# Patient Record
Sex: Female | Born: 1990 | Race: Black or African American | Hispanic: No | Marital: Single | Smoking: Current every day smoker
Health system: Southern US, Community
[De-identification: ages and names within clinical notes are randomized; demographics above are authoritative.]

## PROBLEM LIST (undated history)

## (undated) DIAGNOSIS — Z789 Other specified health status: Secondary | ICD-10-CM

---

## 2008-10-17 ENCOUNTER — Emergency Department: Payer: Self-pay | Admitting: Emergency Medicine

## 2015-04-09 ENCOUNTER — Emergency Department
Admission: EM | Admit: 2015-04-09 | Discharge: 2015-04-09 | Disposition: A | Discharge status: Home / Self Care | Payer: Self-pay | Attending: Emergency Medicine | Admitting: Emergency Medicine

## 2015-04-09 ENCOUNTER — Encounter: Payer: Self-pay | Admitting: Emergency Medicine

## 2015-04-09 DIAGNOSIS — Z720 Tobacco use: Secondary | ICD-10-CM | POA: Insufficient documentation

## 2015-04-09 DIAGNOSIS — J039 Acute tonsillitis, unspecified: Secondary | ICD-10-CM | POA: Insufficient documentation

## 2015-04-09 HISTORY — DX: Other specified health status: Z78.9

## 2015-04-09 LAB — POCT RAPID STREP A: Streptococcus, Group A Screen (Direct): NEGATIVE

## 2015-04-09 MED ORDER — LIDOCAINE VISCOUS 2 % MT SOLN: 20.0000 mL | OROMUCOSAL | Status: DC | PRN

## 2015-04-09 MED ORDER — AMOXICILLIN 500 MG PO TABS: 500.0000 mg | ORAL_TABLET | Freq: Two times a day (BID) | ORAL | Status: DC

## 2015-04-09 NOTE — ED Provider Notes (Signed)
[facility] Emergency Department Provider Note  ____________________________________________  Time seen: Approximately 9:58 AM  I have reviewed the triage vital signs and the nursing notes.   HISTORY  Chief Complaint Sore Throat   HPI Molly Shah is a 24 y.o. female Presents for evaluation of a 3 day history of sore throat. Patient states is progressively getting worse. Denies any fever chills nausea vomiting. States appetite is okay.   Past Medical History  Diagnosis Date  . Patient denies medical problems     There are no active problems to display for this patient.   No past surgical history on file.  Current Outpatient Rx  Name  Route  Sig  Dispense  Refill  . amoxicillin (AMOXIL) 500 MG tablet   Oral   Take 1 tablet (500 mg total) by mouth 2 (two) times daily.   20 tablet   0   . lidocaine (XYLOCAINE) 2 % solution   Mouth/Throat   Use as directed 20 mLs in the mouth or throat as needed for mouth pain.   100 mL   0     Allergies Review of patient's allergies indicates no known allergies.  No family history on file.  Social History Social History  Substance Use Topics  . Smoking status: Current Every Day Smoker  . Smokeless tobacco: Not on file  . Alcohol Use: Yes    Review of Systems Constitutional: No fever/chills Eyes: No visual changes. ENT: Positive for sore throat. Cardiovascular: Denies chest pain. Respiratory: Denies shortness of breath. Gastrointestinal: No abdominal pain.  No nausea, no vomiting.  No diarrhea.  No constipation. Genitourinary: Negative for dysuria. Musculoskeletal: Negative for back pain. Skin: Negative for rash. Neurological: Negative for headaches, focal weakness or numbness.  10-point ROS otherwise negative.  ____________________________________________   PHYSICAL EXAM:  VITAL SIGNS: ED Triage Vitals  Enc Vitals Group     BP 04/09/15 0911 117/68 mmHg     Pulse Rate 04/09/15 0911  74     Resp 04/09/15 0911 20     Temp 04/09/15 0911 98.1 F (36.7 C)     Temp Source 04/09/15 0911 Oral     SpO2 04/09/15 0911 100 %     Weight 04/09/15 0911 165 lb (74.844 kg)     Height 04/09/15 0911  (1.626 m)     Head Cir --      Peak Flow --      Pain Score 04/09/15 0911 8     Pain Loc --      Pain Edu? --      Excl. in GC? --     Constitutional: Alert and oriented. Well appearing and in no acute distress. Eyes: Conjunctivae are normal. PERRL. EOMI. Head: Atraumatic. Nose: No congestion/rhinnorhea. Mouth/Throat: Mucous membranes are moist.  Oral pharynx mildly erythematous with tonsillar exudate noted right greater than left. Neck: No stridor.  No cervical adenopathy. Cardiovascular: Normal rate, regular rhythm. Grossly normal heart sounds.  Good peripheral circulation. Respiratory: Normal respiratory effort.  No retractions. Lungs CTAB. Gastrointestinal: Soft and nontender. No distention. No abdominal bruits. No CVA tenderness. Musculoskeletal: No lower extremity tenderness nor edema.  No joint effusions. Neurologic:  Normal speech and language. No gross focal neurologic deficits are appreciated. No gait instability. Skin:  Skin is warm, dry and intact. No rash noted. Psychiatric: Mood and affect are normal. Speech and behavior are normal.  ____________________________________________   LABS (all labs ordered are listed, but only abnormal results are displayed)  Labs Reviewed  POCT RAPID STREP A   Rapid strep test negative. ___________________________________________   PROCEDURES  Procedure(s) performed: None  Critical Care performed: No  ____________________________________________   INITIAL IMPRESSION / ASSESSMENT AND PLAN / ED COURSE  Pertinent labs & imaging results that were available during my care of the patient were reviewed by me and considered in my medical decision making (see chart for details).  Acute exudative tonsillitis. Rx given for  amoxicillin 500 mg 3 times a day #30 Rx given for viscous lidocaine. Patient follow-up with PCP or return to the ER with any worsening symptomology. Work note given for 24 hours. She voices no other emergency medical complaints at this visit. ____________________________________________   FINAL CLINICAL IMPRESSION(S) / ED DIAGNOSES  Final diagnoses:  Tonsillitis with exudate      Evangeline Dakin, PA-C 04/09/15 [address] Shevaun Lovan, PA-C 04/09/15 1013  Sharyn Creamer, MD 04/09/15 [phone]

## 2015-04-09 NOTE — ED Notes (Signed)
Pt reports sore throat for the past 3 days. Denies fevers, NVD. Pt reports hurts to swallow.

## 2015-04-09 NOTE — Discharge Instructions (Signed)

## 2015-04-11 LAB — CULTURE, GROUP A STREP (THRC)

## 2016-04-14 ENCOUNTER — Ambulatory Visit
Admission: RE | Admit: 2016-04-14 | Discharge: 2016-04-14 | Disposition: A | Discharge status: Home / Self Care | Payer: Self-pay | Source: Ambulatory Visit | Attending: Chiropractor | Admitting: Chiropractor

## 2016-04-14 ENCOUNTER — Other Ambulatory Visit: Payer: Self-pay | Admitting: Chiropractor

## 2016-04-14 DIAGNOSIS — M545 Low back pain: Secondary | ICD-10-CM

## 2016-04-14 DIAGNOSIS — M542 Cervicalgia: Secondary | ICD-10-CM

## 2016-04-14 DIAGNOSIS — M546 Pain in thoracic spine: Secondary | ICD-10-CM | POA: Insufficient documentation

## 2016-09-07 ENCOUNTER — Emergency Department
Admission: EM | Admit: 2016-09-07 | Discharge: 2016-09-07 | Disposition: A | Discharge status: Home / Self Care | Payer: Self-pay | Attending: Emergency Medicine | Admitting: Emergency Medicine

## 2016-09-07 DIAGNOSIS — J302 Other seasonal allergic rhinitis: Secondary | ICD-10-CM | POA: Insufficient documentation

## 2016-09-07 DIAGNOSIS — F17200 Nicotine dependence, unspecified, uncomplicated: Secondary | ICD-10-CM | POA: Insufficient documentation

## 2016-09-07 MED ORDER — FLUTICASONE PROPIONATE 50 MCG/ACT NA SUSP: 2.0000 | Freq: Every day | NASAL | 0 refills | Status: AC

## 2016-09-07 NOTE — ED Provider Notes (Signed)
[facility] Emergency Department Provider Note   ____________________________________________   First MD Initiated Contact with Patient 09/07/16 1030     (approximate)  I have reviewed the triage vital signs and the nursing notes.   HISTORY  Chief Complaint Sore Throat    HPI Molly Shah is a 26 y.o. female is here with complaint of sore throat for the last 3 days. Patient states she has not had any fever. She does have complaints of cough, sneeze, nasal congestion with her sore throat. She has taken Tylenol without any relief.Patient rates her pain as 3 out of 10.   Past Medical History:  Diagnosis Date  . Patient denies medical problems     There are no active problems to display for this patient.   History reviewed. No pertinent surgical history.  Prior to Admission medications   Medication Sig Start Date End Date Taking? Authorizing Provider  fluticasone (FLONASE) 50 MCG/ACT nasal spray Place 2 sprays into both nostrils daily. 09/07/16 09/07/17  Tommi Rumps, PA-C    Allergies Patient has no known allergies.  No family history on file.  Social History Social History  Substance Use Topics  . Smoking status: Current Every Day Smoker  . Smokeless tobacco: Never Used  . Alcohol use Yes    Review of Systems Constitutional: No fever/chills Eyes: No visual changes. ENT: Positive sore throat. Cardiovascular: Denies chest pain. Respiratory: Denies shortness of breath. Positive occasional cough. Gastrointestinal:   No nausea, no vomiting. Genitourinary: Negative for dysuria. Musculoskeletal: Negative for body aches. Skin: Negative for rash. Neurological: Negative for headaches, focal weakness or numbness.  10-point ROS otherwise negative.  ____________________________________________   PHYSICAL EXAM:  VITAL SIGNS: ED Triage Vitals  Enc Vitals Group     BP 09/07/16 0928 115/66     Pulse Rate 09/07/16 0928 78     Resp  09/07/16 0928 18     Temp 09/07/16 0928 98.2 F (36.8 C)     Temp Source 09/07/16 0928 Oral     SpO2 09/07/16 0928 98 %     Weight 09/07/16 0924 157 lb (71.2 kg)     Height 09/07/16 0924 5\' 4"  (1.626 m)     Head Circumference --      Peak Flow --      Pain Score 09/07/16 0925 3     Pain Loc --      Pain Edu? --      Excl. in GC? --     Constitutional: Alert and oriented. Well appearing and in no acute distress. Eyes: Conjunctivae are normal. PERRL. EOMI. Head: Atraumatic. Nose: Mild congestion/rhinnorhea. Slightly pale, watery turbinates.  TMs are clear bilaterally. Mouth/Throat: Mucous membranes are moist.  Oropharynx non-erythematous. Positive posterior drainage. Neck: No stridor.   Hematological/Lymphatic/Immunilogical: No cervical lymphadenopathy. Cardiovascular: Normal rate, regular rhythm. Grossly normal heart sounds.  Good peripheral circulation. Respiratory: Normal respiratory effort.  No retractions. Lungs CTAB. Musculoskeletal: No lower extremity tenderness nor edema.  No joint effusions. Neurologic:  Normal speech and language. No gross focal neurologic deficits are appreciated. No gait instability. Skin:  Skin is warm, dry and intact. No rash noted. Psychiatric: Mood and affect are normal. Speech and behavior are normal.  ____________________________________________   LABS (all labs ordered are listed, but only abnormal results are displayed)  Labs Reviewed - No data to display   PROCEDURES  Procedure(s) performed: None  Procedures  Critical Care performed: No  ____________________________________________   INITIAL IMPRESSION / ASSESSMENT AND  PLAN / ED COURSE  Pertinent labs & imaging results that were available during my care of the patient were reviewed by me and considered in my medical decision making (see chart for details).   [facility] if any continued problems. She'll continue taking Tylenol or  ibuprofen if needed for fever, sore throat or headache. Patient is to increase fluids. She is also occurs to obtain Zyrtec or Claritin as needed for symptoms and begin using Flonase nasal spray 2 sprays to each nostril once a day.     ____________________________________________   FINAL CLINICAL IMPRESSION(S) / ED DIAGNOSES  Final diagnoses:  Acute seasonal allergic rhinitis, unspecified trigger      NEW MEDICATIONS STARTED DURING THIS VISIT:  Discharge Medication List as of 09/07/2016 10:51 AM    START taking these medications   Details  fluticasone (FLONASE) 50 MCG/ACT nasal spray Place 2 sprays into both nostrils daily., Starting Sun 09/07/2016, Until Mon 09/07/2017, Print         Note:  This document was prepared using Dragon voice recognition software and may include unintentional dictation errors.    Tommi RumpsRhonda L Summers, PA-C 09/07/16 1605    Governor Rooksebecca Lord, MD 09/10/16 [phone]

## 2016-09-07 NOTE — Discharge Instructions (Signed)
Follow-up with [facility] acute care clinic if any continued problems. Tylenol and ibuprofen if needed for fever, sore throat pain. Increase fluids. Obtain either Zyrtec D or Claritin-D for your symptoms. Begin Flonase nasal spray 2 sprays each nostril once a day.

## 2016-09-07 NOTE — ED Triage Notes (Signed)
Pt c/o sore throat for the past 3 days.  

## 2016-12-11 ENCOUNTER — Other Ambulatory Visit: Payer: Self-pay | Admitting: Family Medicine

## 2016-12-11 DIAGNOSIS — Z3482 Encounter for supervision of other normal pregnancy, second trimester: Secondary | ICD-10-CM

## 2017-01-08 ENCOUNTER — Encounter: Payer: Self-pay | Admitting: Radiology

## 2017-01-08 ENCOUNTER — Ambulatory Visit
Admission: RE | Admit: 2017-01-08 | Discharge: 2017-01-08 | Disposition: A | Discharge status: Home / Self Care | Payer: Medicaid Other | Source: Ambulatory Visit | Attending: Family Medicine | Admitting: Family Medicine

## 2017-01-08 ENCOUNTER — Ambulatory Visit: Admission: RE | Admit: 2017-01-08 | Payer: Self-pay | Source: Ambulatory Visit

## 2017-01-08 DIAGNOSIS — Z3482 Encounter for supervision of other normal pregnancy, second trimester: Secondary | ICD-10-CM | POA: Insufficient documentation

## 2017-02-24 ENCOUNTER — Other Ambulatory Visit: Payer: Self-pay | Admitting: Family Medicine

## 2017-02-24 DIAGNOSIS — Z3482 Encounter for supervision of other normal pregnancy, second trimester: Secondary | ICD-10-CM

## 2017-02-27 ENCOUNTER — Ambulatory Visit
Admission: RE | Admit: 2017-02-27 | Discharge: 2017-02-27 | Disposition: A | Discharge status: Home / Self Care | Payer: Medicaid Other | Source: Ambulatory Visit | Attending: Family Medicine | Admitting: Family Medicine

## 2017-03-13 ENCOUNTER — Other Ambulatory Visit: Payer: Self-pay | Admitting: Family Medicine

## 2017-03-13 DIAGNOSIS — Z3689 Encounter for other specified antenatal screening: Secondary | ICD-10-CM

## 2017-03-23 ENCOUNTER — Ambulatory Visit: Payer: Self-pay
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
## 2018-01-11 IMAGING — US US OB COMP +14 WK
1 series · 13 of 28 positions shown · non-contrast
Comparison: none

CLINICAL DATA: Pregnancy.  Evaluate fetal anatomy.

EXAM:
OBSTETRICAL ULTRASOUND >14 WKS

[Series 1: us ob comp +14 wk · 0.22mm/px · 13 of 98 slices shown]
[im 4/98]
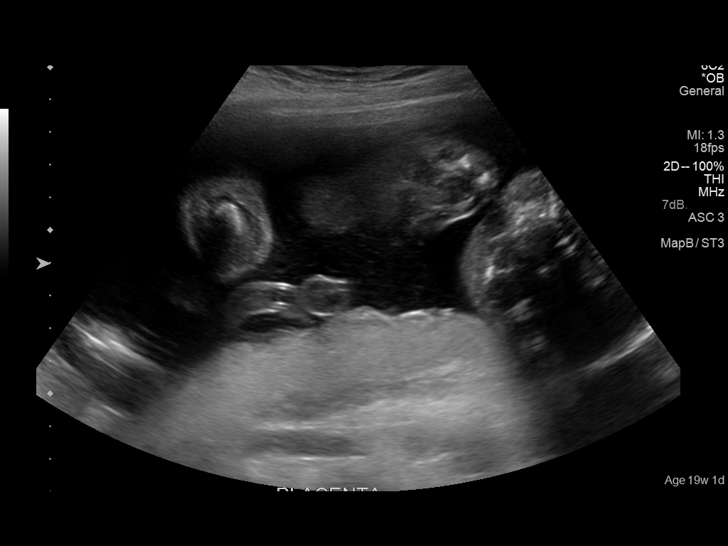
[im 11/98]
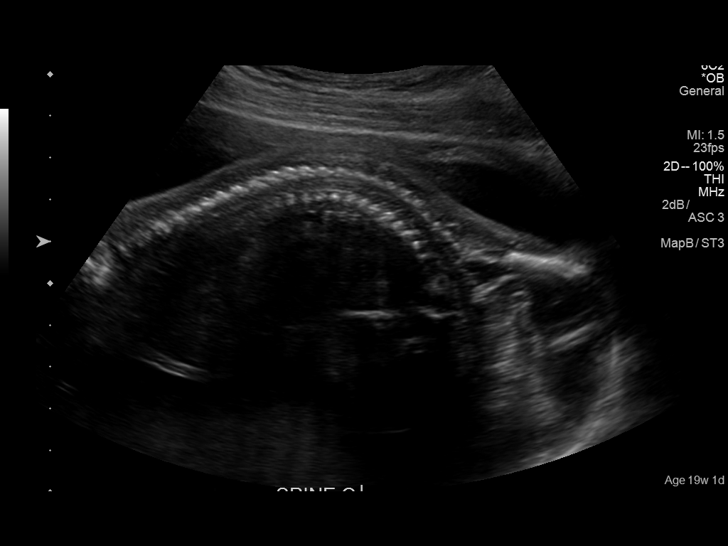
[im 18/98]
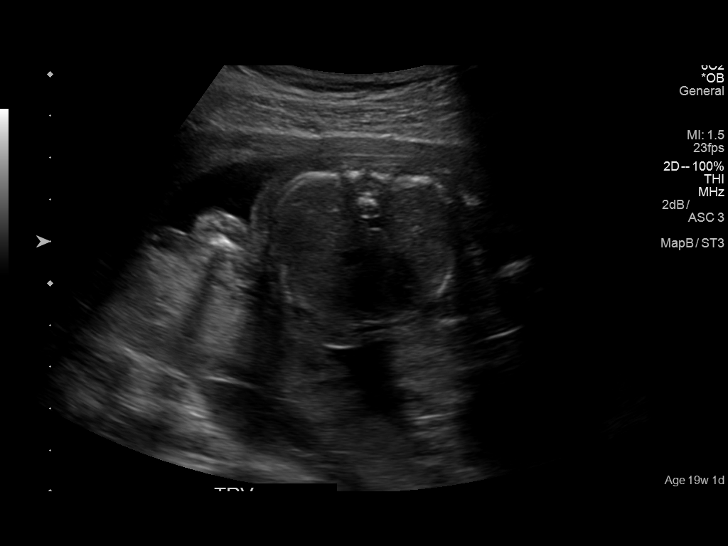
[im 26/98]
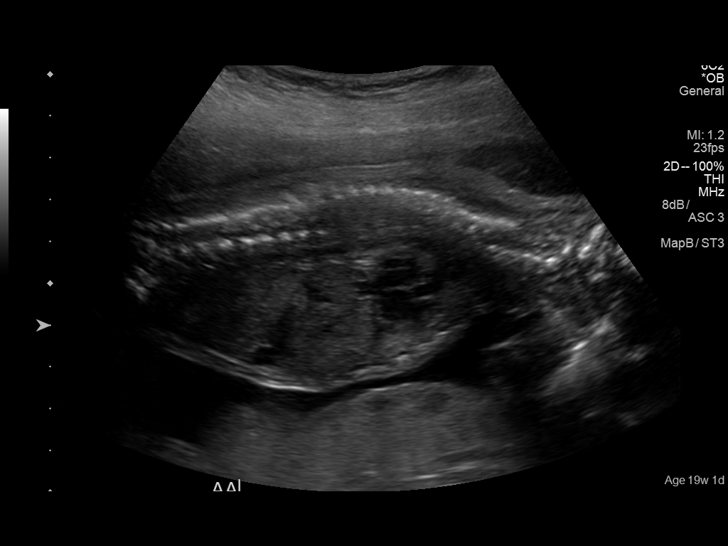
[im 33/98]
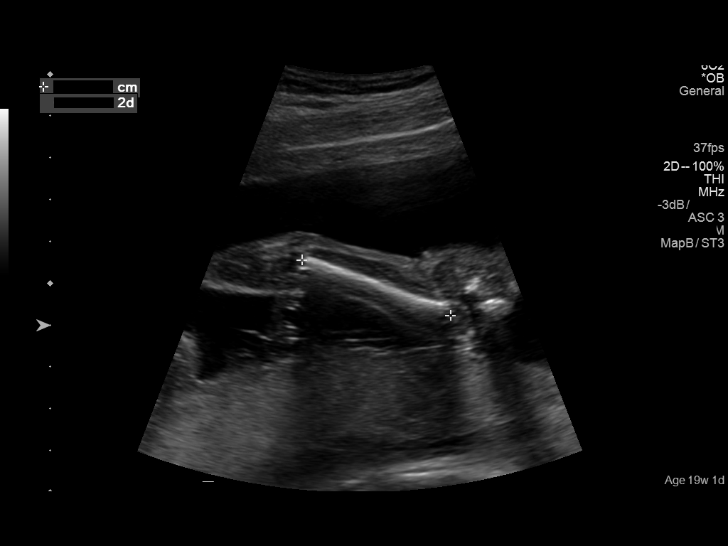
[im 40/98]
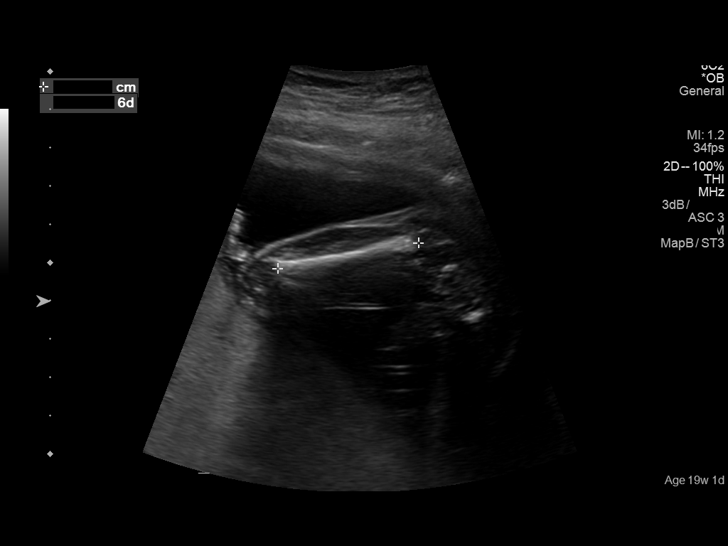
[im 51/98]
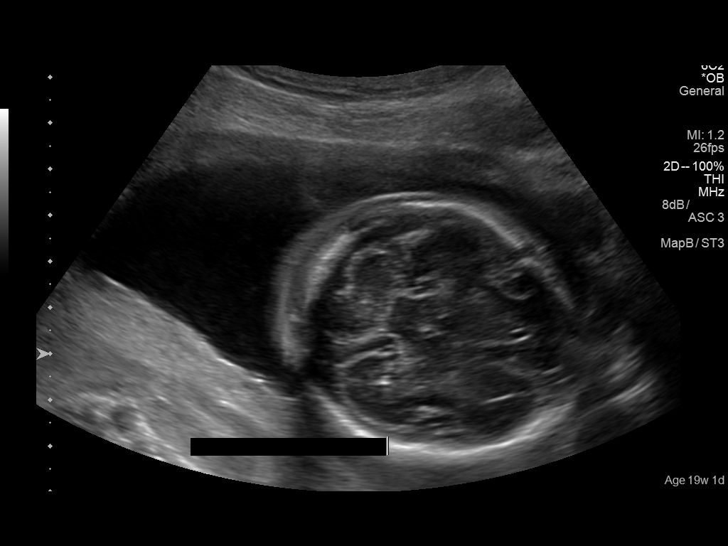
[im 58/98]
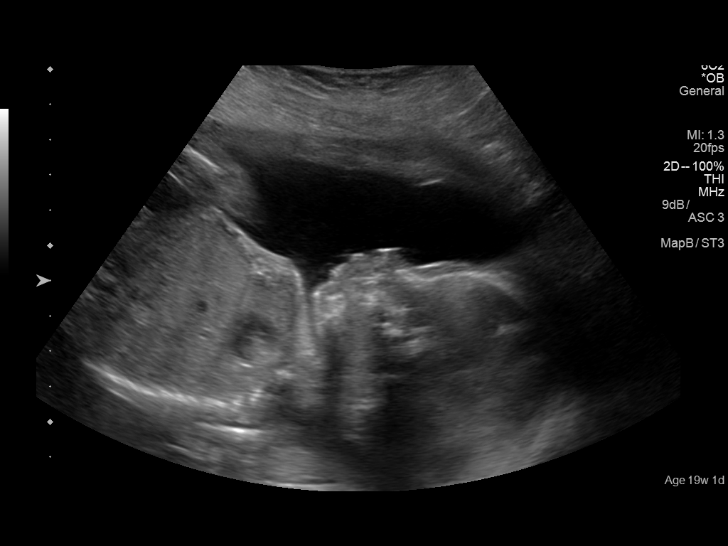
[im 65/98]
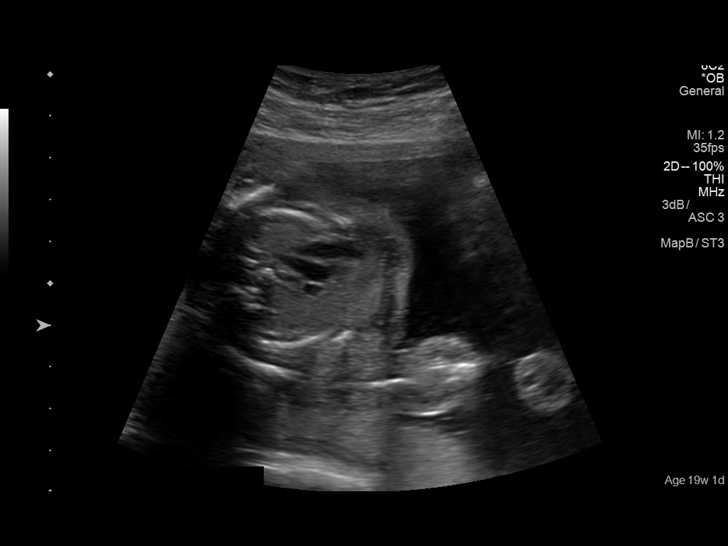
[im 72/98]
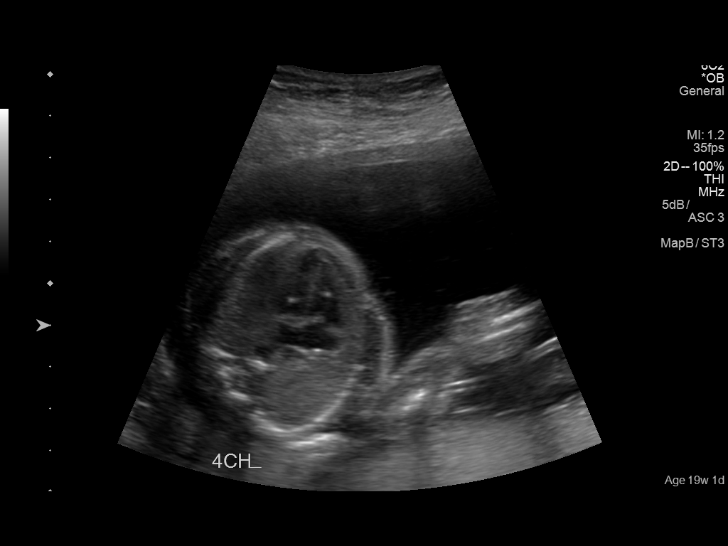
[im 80/98]
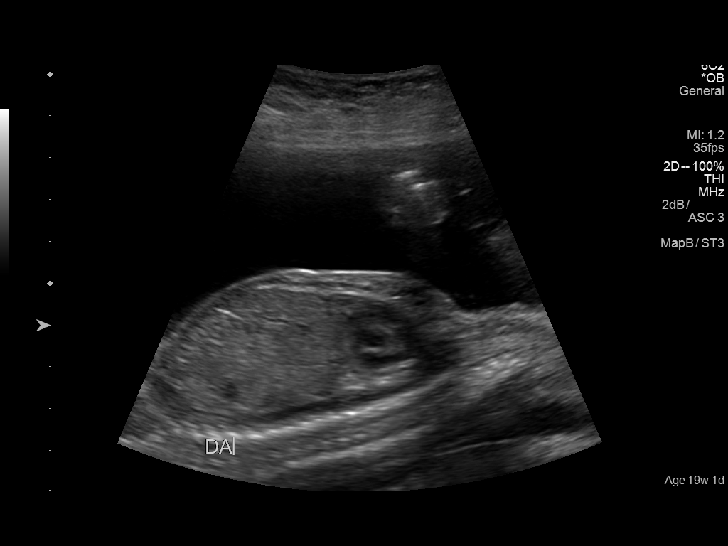
[im 87/98]
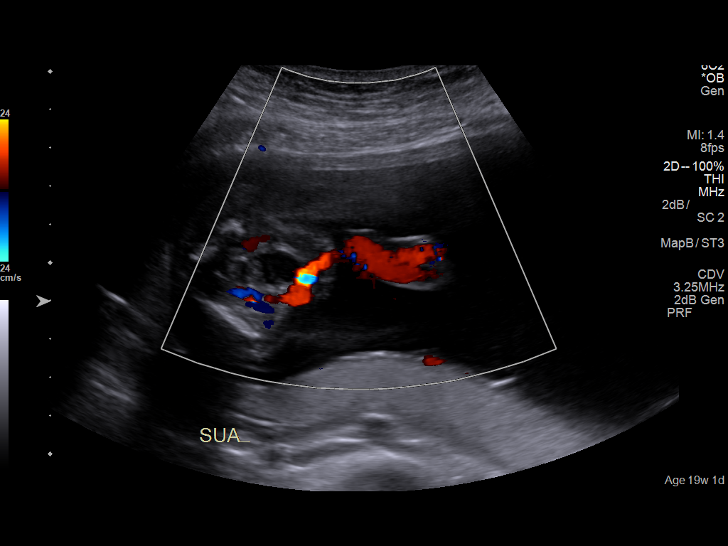
[im 94/98]
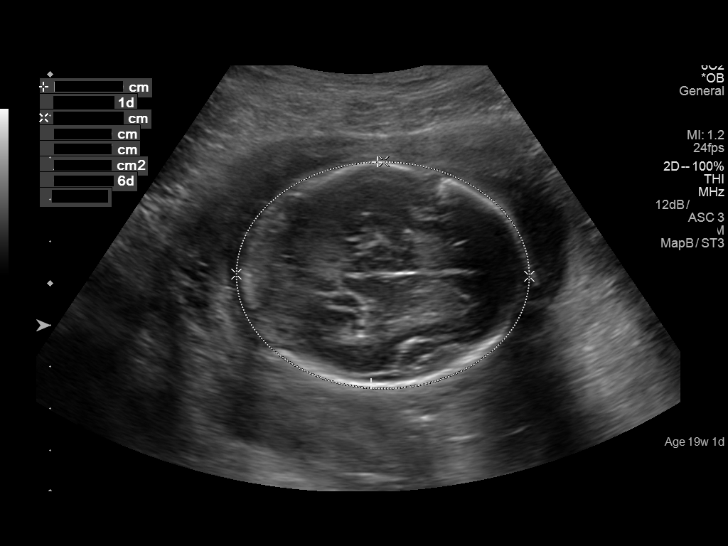

[13 of 28 positions shown; findings below may reference images not displayed]

FINDINGS: Number of Fetuses: 1

Heart Rate:  153 bpm

Movement: Present

Presentation: Cephalic

Previa: No

Placental Location: Posterior

Amniotic Fluid (Subjective): Normal

Amniotic Fluid (Objective):

FETAL BIOMETRY

BPD:  5.3cm 22w 0d

HC:    19.5cm  21w   5d

AC:   16.4cm  21w   3d

FL:   3.8cm  22w   2d

Current Mean GA: 22w 0d              US EDC: 07/14/2016

FETAL ANATOMY

Lateral Ventricles: Visualized

Thalami/CSP: Visualized

Posterior Fossa:  Visualized

Nuchal Region: Visualized    NFT= 5.3mm

Upper Lip: Visualized

Spine: Visualized

4 Chamber Heart on Left: Visualized. Echogenic foci appear to be
present bilaterally.

LVOT: Visualized

RVOT: Visualized

Stomach on Left: Visualized

3 Vessel Cord: Probable single umbilical artery.

Cord Insertion site: Visualized

Kidneys: Visualized

Bladder: Visualized

Extremities: Visualized

Maternal Findings:

Cervix:  3.7 cm and closed.
IMPRESSION: Single viable intrauterine pregnancy at 22 weeks 0 days.
Biventricular echogenic foci. Patch that Probable single umbilical
artery. Ultrasound at a tertiary care center suggested for further
evaluation.

## 2019-04-05 ENCOUNTER — Other Ambulatory Visit: Payer: Self-pay | Admitting: *Deleted

## 2019-04-05 DIAGNOSIS — Z20822 Contact with and (suspected) exposure to covid-19: Secondary | ICD-10-CM

## 2019-04-06 LAB — NOVEL CORONAVIRUS, NAA: SARS-CoV-2, NAA: NOT DETECTED

## 2019-04-09 ENCOUNTER — Telehealth: Payer: Self-pay

## 2019-04-09 NOTE — Telephone Encounter (Signed)
Pt. Called back, given COVID 19 results.
